# Patient Record
Sex: Female | Born: 1990 | Race: White | Hispanic: No | Marital: Single | Smoking: Never smoker
Health system: Southern US, Community
[De-identification: ages and names within clinical notes are randomized; demographics above are authoritative.]

## PROBLEM LIST (undated history)

## (undated) DIAGNOSIS — I10 Essential (primary) hypertension: Secondary | ICD-10-CM

---
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
## 2011-06-15 ENCOUNTER — Emergency Department: Payer: Self-pay | Admitting: Emergency Medicine

## 2011-06-19 ENCOUNTER — Emergency Department: Payer: Self-pay | Admitting: Emergency Medicine

## 2011-11-04 ENCOUNTER — Emergency Department: Payer: Self-pay | Admitting: Emergency Medicine

## 2013-06-05 ENCOUNTER — Emergency Department: Payer: Self-pay | Admitting: Emergency Medicine

## 2013-06-08 LAB — BETA STREP CULTURE(ARMC)

## 2014-02-13 ENCOUNTER — Emergency Department: Payer: Self-pay | Admitting: Emergency Medicine

## 2014-04-04 ENCOUNTER — Emergency Department: Payer: Self-pay | Admitting: Student

## 2014-04-04 LAB — CBC
HCT: 38.5 % (ref 35.0–47.0)
HGB: 12.2 g/dL (ref 12.0–16.0)
MCH: 25.4 pg — AB (ref 26.0–34.0)
MCHC: 31.6 g/dL — ABNORMAL LOW (ref 32.0–36.0)
MCV: 81 fL (ref 80–100)
PLATELETS: 326 10*3/uL (ref 150–440)
RBC: 4.78 10*6/uL (ref 3.80–5.20)
RDW: 15 % — ABNORMAL HIGH (ref 11.5–14.5)
WBC: 14.4 10*3/uL — ABNORMAL HIGH (ref 3.6–11.0)

## 2014-04-04 LAB — MONONUCLEOSIS SCREEN: MONO TEST: NEGATIVE

## 2014-04-06 LAB — BETA STREP CULTURE(ARMC)

## 2014-08-10 ENCOUNTER — Emergency Department: Admit: 2014-08-10 | Disposition: A | Discharge status: Home / Self Care | Payer: Self-pay | Admitting: Student

## 2015-07-19 ENCOUNTER — Encounter: Payer: Self-pay | Admitting: Emergency Medicine

## 2015-07-19 ENCOUNTER — Emergency Department
Admission: EM | Admit: 2015-07-19 | Discharge: 2015-07-19 | Disposition: A | Discharge status: Home / Self Care | Payer: Self-pay | Attending: Student | Admitting: Student

## 2015-07-19 DIAGNOSIS — J020 Streptococcal pharyngitis: Secondary | ICD-10-CM | POA: Insufficient documentation

## 2015-07-19 DIAGNOSIS — I10 Essential (primary) hypertension: Secondary | ICD-10-CM | POA: Insufficient documentation

## 2015-07-19 HISTORY — DX: Essential (primary) hypertension: I10

## 2015-07-19 LAB — POCT RAPID STREP A: STREPTOCOCCUS, GROUP A SCREEN (DIRECT): POSITIVE — AB

## 2015-07-19 MED ORDER — LIDOCAINE VISCOUS 2 % MT SOLN: 5.0000 mL | Freq: Four times a day (QID) | OROMUCOSAL | Status: DC | PRN

## 2015-07-19 MED ORDER — PSEUDOEPH-BROMPHEN-DM 30-2-10 MG/5ML PO SYRP: 5.0000 mL | ORAL_SOLUTION | Freq: Four times a day (QID) | ORAL | Status: DC | PRN

## 2015-07-19 MED ORDER — AMOXICILLIN 500 MG PO CAPS: 500.0000 mg | ORAL_CAPSULE | Freq: Three times a day (TID) | ORAL | Status: DC

## 2015-07-19 NOTE — ED Provider Notes (Signed)
[facility] Emergency Department Provider Note ____________________________________________  Time seen: Approximately 8:21 AM  I have reviewed the triage vital signs and the nursing notes.   HISTORY  Chief Complaint Sore Throat    HPI Vanessa Cowan is a 25 y.o. female patient complaining of sore throat for 3 days. Patient denies any other URI signs or symptoms. Patient stated no fever associated with this complaint.Patient state able to tolerate fluids but food causes discomfort. No palliative measures for this complaint. Patient rates her pain discomfort as a 6/10. Patient described the pain as sharp.   Past Medical History  Diagnosis Date  . Hypertension     There are no active problems to display for this patient.   History reviewed. No pertinent past surgical history.  Current Outpatient Rx  Name  Route  Sig  Dispense  Refill  . amoxicillin (AMOXIL) 500 MG capsule   Oral   Take 1 capsule (500 mg total) by mouth 3 (three) times daily.   30 capsule   0   . brompheniramine-pseudoephedrine-DM 30-2-10 MG/5ML syrup   Oral   Take 5 mLs by mouth 4 (four) times daily as needed. Mixed with 5 mL of viscous lidocaine swish and swallow.   120 mL   0   . lidocaine (XYLOCAINE) 2 % solution   Mouth/Throat   Use as directed 5 mLs in the mouth or throat every 6 (six) hours as needed for mouth pain. Mixed with 5 mL of Bromfed-DM for swish and swallow   100 mL   0     Allergies Review of patient's allergies indicates no known allergies.  No family history on file.  Social History Social History  Substance Use Topics  . Smoking status: Never Smoker   . Smokeless tobacco: None  . Alcohol Use: None    Review of Systems Constitutional: No fever/chills Eyes: No visual changes. ENT: No sore throat. Cardiovascular: Denies chest pain. Respiratory: Denies shortness of breath. Gastrointestinal: No abdominal pain.  No nausea, no vomiting.  No  diarrhea.  No constipation. Genitourinary: Negative for dysuria. Musculoskeletal: Negative for back pain. Skin: Negative for rash. Neurological: Negative for headaches, focal weakness or numbness. Endocrine:Hypertension  ____________________________________________   PHYSICAL EXAM:  VITAL SIGNS: ED Triage Vitals  Enc Vitals Group     BP 07/19/15 0808 160/88 mmHg     Pulse Rate 07/19/15 0808 84     Resp 07/19/15 0808 18     Temp 07/19/15 0808 98.6 F (37 C)     Temp Source 07/19/15 0808 Oral     SpO2 07/19/15 0808 97 %     Weight 07/19/15 0808 320 lb (145.151 kg)     Height 07/19/15 0808 5\' 7"  (1.702 m)     Head Cir --      Peak Flow --      Pain Score 07/19/15 0805 6     Pain Loc --      Pain Edu? --      Excl. in [place]? --     Constitutional: Alert and oriented. Well appearing and in no acute distress. Eyes: Conjunctivae are normal. PERRL. EOMI. Head: Atraumatic. Nose: No congestion/rhinnorhea. Mouth/Throat: Mucous membranes are moist.  Oropharynx erythematous. Edematous and exudative tonsils Neck: No stridor.  No cervical spine tenderness to palpation. Hematological/Lymphatic/Immunilogical: Bilateral cervical lymphadenopathy. Cardiovascular: Normal rate, regular rhythm. Grossly normal heart sounds.  Good peripheral circulation. Elevated blood pressure Respiratory: Normal respiratory effort.  No retractions. Lungs CTAB. Gastrointestinal: Soft and nontender.  No distention. No abdominal bruits. No CVA tenderness. Musculoskeletal: No lower extremity tenderness nor edema.  No joint effusions. Neurologic:  Normal speech and language. No gross focal neurologic deficits are appreciated. No gait instability. Skin:  Skin is warm, dry and intact. No rash noted. Psychiatric: Mood and affect are normal. Speech and behavior are normal.  ____________________________________________   LABS (all labs ordered are listed, but only abnormal results are displayed)  Labs Reviewed  POCT  RAPID STREP A - Abnormal; Notable for the following:    Streptococcus, Group A Screen (Direct) POSITIVE (*)    All other components within normal limits   ____________________________________________  EKG   ____________________________________________  RADIOLOGY   ____________________________________________   PROCEDURES  Procedure(s) performed: None  Critical Care performed: No  ____________________________________________   INITIAL IMPRESSION / ASSESSMENT AND PLAN / ED COURSE  Pertinent labs & imaging results that were available during my care of the patient were reviewed by me and considered in my medical decision making (see chart for details).  Strep pharyngitis. Patient given discharge care instructions. Patient given prescription for amoxicillin, Bromfed-DM, and viscous lidocaine. Advised to follow-up with "clinic if condition persists. ____________________________________________   FINAL CLINICAL IMPRESSION(S) / ED DIAGNOSES  Final diagnoses:  Strep pharyngitis      Sable Feil, PA-C 07/19/15 [id number]  Joanne Gavel, MD 07/19/15 1622

## 2015-07-19 NOTE — ED Notes (Signed)
Sore throat x 3 days.  No resp distress.

## 2015-07-19 NOTE — Discharge Instructions (Signed)

## 2015-07-25 ENCOUNTER — Other Ambulatory Visit: Payer: Self-pay | Admitting: Obstetrics and Gynecology

## 2015-07-25 DIAGNOSIS — N6452 Nipple discharge: Secondary | ICD-10-CM

## 2015-08-01 ENCOUNTER — Ambulatory Visit
Admission: RE | Admit: 2015-08-01 | Discharge: 2015-08-01 | Disposition: A | Discharge status: Home / Self Care | Payer: 59 | Source: Ambulatory Visit | Attending: Obstetrics and Gynecology | Admitting: Obstetrics and Gynecology

## 2015-08-01 DIAGNOSIS — N6452 Nipple discharge: Secondary | ICD-10-CM | POA: Diagnosis not present

## 2015-08-06 ENCOUNTER — Other Ambulatory Visit: Payer: Self-pay | Admitting: Obstetrics and Gynecology

## 2015-08-06 DIAGNOSIS — N6042 Mammary duct ectasia of left breast: Secondary | ICD-10-CM

## 2015-08-06 DIAGNOSIS — N6459 Other signs and symptoms in breast: Secondary | ICD-10-CM

## 2015-08-27 ENCOUNTER — Encounter: Payer: Self-pay | Admitting: Dietician

## 2015-08-27 ENCOUNTER — Encounter: Payer: 59 | Attending: Obstetrics and Gynecology | Admitting: Dietician

## 2015-08-27 VITALS — Ht 67.0 in | Wt 338.8 lb

## 2015-08-27 DIAGNOSIS — E669 Obesity, unspecified: Secondary | ICD-10-CM | POA: Insufficient documentation

## 2015-08-27 NOTE — Addendum Note (Signed)
Addended by: Gloriajean Dell on: 08/27/2015 11:58 AM   Modules accepted: Medications

## 2015-08-27 NOTE — Progress Notes (Signed)
Medical Nutrition Therapy: Visit start time: 8:50am  end time:9:50 Assessment:  Diagnosis: obesity Past medical history: hypertension Psychosocial issues/ stress concerns: Patient rates her stress as moderate and indicates she copes by eating. Preferred learning method:  . Auditory . Hands-on Current weight: 338.8 lbs  Height: 67 in  Medications, supplements: see list  Progress and evaluation:  Patient in for initial medical nutrition therapy assessment. Her main focus is desire to lose weight. She gives a goal weight of 200 lbs which she weighed in high school. She attributes excessive calories to frequent eating out or take outs from fast foods, late night snacking and frequent intake of soda and sweet tea.She likes a variety of fruits and vegetables and likes milk and yogurt but diet is low in these food groups. In the past week, she has made positive diet changes and reports she is eating less, has decreased soda intake and has limited late night snacking. She prefers salty, crunchy snacks to sweets.   Physical activity: none. 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  Dietary Intake:  Usual eating pattern includes 2 meals and 1-2snacks per day. Dining out frequency: 3 meals per week.  Breakfast: usually skips breakfast. She takes her niece to school and then comes home and sleeps from 8-10:30am.  Lunch: 11:30am- pizza or grilled chicken salad or chicken sandwich, salad, sweet tea Snack: chips Supper: 5:30-6:00pm-baked chicken, rice, pinto or black beans, water or sweet tea or steak with same sides  Snack: has not been eating after dinner snack for the past week. Previously she would eat a hamburger for example. Beverages: water, sweet tea   Nutrition Care Education: Basic nutrition: Instructed on food group servings needed to meet basic nutrient needs Weight control: Instructed on a meal pattern of 3 meals per day including breakfast and  used food guide plate and food models to show how to better balance meals especially by adding fruits and vegetables. Encouraged to focus on adding foods needed to meet nutrient needs vs. focus on restriction. Commended on positive changes she has made thus far. Discussed how preparing more meals at home and eating less fast food will help in decreasing her sodium intake as well discussing relation of sodium to hypertension. Gave and reviewed menus. Encouraged to include some structured exercise especially since she has a gym membership even if she starts with one day per week. Nutritional Diagnosis:  [place]-3.3 Overweight/obesity As related to history of frequent high fat fast food meals, sweetened beverages, late night eating and lack of physical activity.  As evidenced by diet history and weight gain of over 100 lbs in 7 years..  Intervention:  Include breakfast daily. Balance meals with protein, 2-4 servings of carbohydrate and non-starchy vegetables. Estimate carbohydrate servings at meals and snacks. Spread 14 servings of carbohydrate over 3 meals and 2-3 snacks. Measure some portions of carbohydrate foods, especially starches. Read labels for carbohydrate grams remembering that 15 gms of carbohydrate equals one serving. Be mindful of meals and snacks. Avoid eating in front of TV or while using other phone. Eat slowly. Allow at least 20 minutes before taking out "seconds".   Education Materials given:  . Plate Planner . Food lists/ Planning A Balanced Meal . Sample meal pattern/ menus . Goals/ instructions  Learner/ who was taught:  . Patient  Level of understanding: . Partial understanding; needs review/ practice Learning barriers: . None  Willingness to learn/ readiness for  change: . Eager, change in progress  Monitoring and Evaluation:  No follow-up scheduled. Encouraged patient to call if she decides she wants to schedule another appointment or if has questions regarding her  diet/nutrition.

## 2015-08-27 NOTE — Patient Instructions (Signed)
Include breakfast daily. Balance meals with protein, 2-4 servings of carbohydrate and non-starchy vegetables. Estimate carbohydrate servings at meals and snacks. Spread 14 servings of carbohydrate over 3 meals and 2-3 snacks. Measure some portions of carbohydrate foods, especially starches. Read labels for carbohydrate grams remembering that 15 gms of carbohydrate equals one serving. Be mindful of meals and snacks. Avoid eating in front of TV or while using other phone. Eat slowly. Allow at least 20 minutes before taking out "seconds".

## 2015-09-04 ENCOUNTER — Ambulatory Visit
Admission: RE | Admit: 2015-09-04 | Discharge: 2015-09-04 | Disposition: A | Discharge status: Home / Self Care | Payer: 59 | Source: Ambulatory Visit | Attending: Obstetrics and Gynecology | Admitting: Obstetrics and Gynecology

## 2015-09-04 ENCOUNTER — Other Ambulatory Visit: Payer: Self-pay | Admitting: Obstetrics and Gynecology

## 2015-09-04 DIAGNOSIS — N6042 Mammary duct ectasia of left breast: Secondary | ICD-10-CM

## 2015-09-04 DIAGNOSIS — N6459 Other signs and symptoms in breast: Secondary | ICD-10-CM

## 2015-09-05 LAB — SURGICAL PATHOLOGY

## 2015-09-06 HISTORY — PX: BREAST BIOPSY: SHX20

## 2015-09-18 ENCOUNTER — Other Ambulatory Visit: Payer: Self-pay | Admitting: Obstetrics and Gynecology

## 2015-09-18 DIAGNOSIS — D242 Benign neoplasm of left breast: Secondary | ICD-10-CM

## 2015-09-30 ENCOUNTER — Ambulatory Visit
Admission: RE | Admit: 2015-09-30 | Discharge: 2015-09-30 | Disposition: A | Discharge status: Home / Self Care | Payer: 59 | Source: Ambulatory Visit | Attending: Obstetrics and Gynecology | Admitting: Obstetrics and Gynecology

## 2015-09-30 DIAGNOSIS — D242 Benign neoplasm of left breast: Secondary | ICD-10-CM | POA: Diagnosis present

## 2017-05-15 ENCOUNTER — Encounter: Payer: Self-pay | Admitting: Emergency Medicine

## 2017-05-15 ENCOUNTER — Emergency Department
Admission: EM | Admit: 2017-05-15 | Discharge: 2017-05-15 | Disposition: A | Discharge status: Home / Self Care | Payer: 59 | Attending: Student in an Organized Health Care Education/Training Program | Admitting: Student in an Organized Health Care Education/Training Program

## 2017-05-15 ENCOUNTER — Other Ambulatory Visit: Payer: Self-pay

## 2017-05-15 ENCOUNTER — Emergency Department: Payer: 59

## 2017-05-15 DIAGNOSIS — J189 Pneumonia, unspecified organism: Secondary | ICD-10-CM

## 2017-05-15 DIAGNOSIS — I10 Essential (primary) hypertension: Secondary | ICD-10-CM | POA: Diagnosis not present

## 2017-05-15 DIAGNOSIS — J181 Lobar pneumonia, unspecified organism: Secondary | ICD-10-CM | POA: Diagnosis not present

## 2017-05-15 DIAGNOSIS — M7918 Myalgia, other site: Secondary | ICD-10-CM | POA: Insufficient documentation

## 2017-05-15 DIAGNOSIS — R05 Cough: Secondary | ICD-10-CM | POA: Diagnosis present

## 2017-05-15 LAB — INFLUENZA PANEL BY PCR (TYPE A & B)
INFLBPCR: NEGATIVE
Influenza A By PCR: NEGATIVE

## 2017-05-15 MED ORDER — IPRATROPIUM-ALBUTEROL 0.5-2.5 (3) MG/3ML IN SOLN: 3.0000 mL | Freq: Once | RESPIRATORY_TRACT | Status: DC

## 2017-05-15 MED ORDER — LABETALOL HCL 200 MG PO TABS: 200.0000 mg | ORAL_TABLET | Freq: Two times a day (BID) | ORAL | 3 refills | Status: AC

## 2017-05-15 MED ORDER — LEVOFLOXACIN 500 MG PO TABS: 500.0000 mg | ORAL_TABLET | Freq: Every day | ORAL | 0 refills | Status: DC

## 2017-05-15 MED ORDER — HYDROCODONE-HOMATROPINE 5-1.5 MG/5ML PO SYRP: 5.0000 mL | ORAL_SOLUTION | Freq: Four times a day (QID) | ORAL | 0 refills | Status: DC | PRN

## 2017-05-15 MED ORDER — IPRATROPIUM-ALBUTEROL 0.5-2.5 (3) MG/3ML IN SOLN
3.0000 mL | Freq: Once | RESPIRATORY_TRACT | Status: AC
  Administered 2017-05-15: 3 mL via RESPIRATORY_TRACT
  Filled 2017-05-15: qty 3

## 2017-05-15 NOTE — ED Triage Notes (Addendum)
Pt c/o productive cough and body aches for 3-4 days, reports subjective fevers.  OTC meds not helping. Mask applied in triage.  Pt also states she has been out of her BP meds for the past week.

## 2017-05-15 NOTE — ED Provider Notes (Signed)
[facility] Emergency Department Provider Note  ____________________________________________   First MD Initiated Contact with Patient 05/15/17 1006     (approximate)  I have reviewed the triage vital signs and the nursing notes.   HISTORY  Chief Complaint Generalized Body Aches and Cough    HPI Vanessa Cowan is a 27 y.o. female who presents to the ER complaining of a productive cough and body aches for 3-4 days.  She states she has had fever while at home.  She states her chest does hurt and she feels short of breath at times.  She denies any vomiting or diarrhea.  She states she is also out of her blood pressure medication  Past Medical History:  Diagnosis Date  . Hypertension     There are no active problems to display for this patient.   Past Surgical History:  Procedure Laterality Date  . BREAST BIOPSY Left 5.19.17    Prior to Admission medications   Medication Sig Start Date End Date Taking? Authorizing Provider  HYDROcodone-homatropine (HYCODAN) 5-1.5 MG/5ML syrup Take 5 mLs by mouth every 6 (six) hours as needed for cough. 05/15/17   Farron Lafond, Linden Dolin, PA-C  IRON, FERROUS SULFATE, PO Take by mouth.    [provider]  labetalol (NORMODYNE) 200 MG tablet Take 1 tablet (200 mg total) by mouth 2 (two) times daily. 05/15/17   Teighlor Korson, Linden Dolin, PA-C  levofloxacin (LEVAQUIN) 500 MG tablet Take 1 tablet (500 mg total) by mouth daily. 05/15/17   Versie Starks, PA-C    Allergies Patient has no known allergies.  Family History  Problem Relation Age of Onset  . Breast cancer Neg Hx     Social History Social History   Tobacco Use  . Smoking status: Never Smoker  . Smokeless tobacco: Never Used  Substance Use Topics  . Alcohol use: Not on file  . Drug use: Not on file    Review of Systems  Constitutional: Positive fever/chills Eyes: No visual changes. ENT: Positive sore throat. Respiratory: Positive cough, wheezing and  some shortness of breath Genitourinary: Negative for dysuria. Musculoskeletal: Negative for back pain. Skin: Negative for rash.    ____________________________________________   PHYSICAL EXAM:  VITAL SIGNS: ED Triage Vitals  Enc Vitals Group     BP 05/15/17 0911 (!) 190/122     Pulse Rate 05/15/17 0911 (!) 127     Resp 05/15/17 0911 20     Temp 05/15/17 0911 99.7 F (37.6 C)     Temp Source 05/15/17 0911 Oral     SpO2 05/15/17 0911 95 %     Weight 05/15/17 0913 290 lb (131.5 kg)     Height 05/15/17 0913 5\' 7"  (1.702 m)     Head Circumference --      Peak Flow --      Pain Score 05/15/17 0912 7     Pain Loc --      Pain Edu? --      Excl. in [place]? --     Constitutional: Alert and oriented.  Patient appears to not feel well but she does not appear toxic  eyes: Conjunctivae are normal.  Head: Atraumatic. Ears: TMs are dull bilaterally Nose: No congestion/rhinnorhea. Mouth/Throat: Mucous membranes are moist.  Throat is red, mouth is dry tongue is coated with white Cardiovascular: Normal rate, regular rhythm.  Heart sounds are normal Respiratory: Normal respiratory effort.  No retractions, lungs with decreased breath sounds in the left mid to upper lobe  GU: deferred Musculoskeletal: FROM all extremities, warm and well perfused Neurologic:  Normal speech and language.  Skin:  Skin is warm, dry and intact. No rash noted. Psychiatric: Mood and affect are normal. Speech and behavior are normal.  ____________________________________________   LABS (all labs ordered are listed, but only abnormal results are displayed)  Labs Reviewed  INFLUENZA PANEL BY PCR (TYPE A & B)   ____________________________________________   ____________________________________________  RADIOLOGY  Chest x-ray is positive for left upper lobe pneumonia  ____________________________________________   PROCEDURES  Procedure(s) performed:  DuoNeb  Procedures    ____________________________________________   INITIAL IMPRESSION / ASSESSMENT AND PLAN / ED COURSE  Pertinent labs & imaging results that were available during my care of the patient were reviewed by me and considered in my medical decision making (see chart for details).  Patient is a 27 year old female presents emergency department complaining of fever and cough.  On physical exam she appears to not feel well, her blood pressure is elevated, her temp is not elevated but she feels warm to touch.  Pulse ox is at 95%.  Lungs were clear except for she had decreased breath sounds in the left mid to upper lobe  Flu test is negative, chest x-ray was positive for left upper lobe pneumonia  X-ray results were discussed with the patient.  After the DuoNeb the patient's lungs are more clear to auscultation.  The patient states she feels better.  She was given a prescription for Levaquin for 500 mg daily for 10 days, Hycodan cough syrup, and a refill prescription on her labetalol 200 mg daily.  She is to follow-up with her regular doctor in 3 days.  She is to call on Monday morning for an appointment and let them know she was seen in the emergency room and diagnosed with pneumonia.  If she is worsening she is to return to the emergency department.  She is to take all medications as prescribed.  Patient states she understands will comply with our recommendations.  She was discharged in stable condition     As part of my medical decision making, I reviewed the following data within the [facility] notes reviewed and incorporated, Labs reviewed , Old chart reviewed, Radiograph reviewed , Notes from prior ED visits and her primary care visits to determine her blood pressure medication ____________________________________________   FINAL CLINICAL IMPRESSION(S) / ED DIAGNOSES  Final diagnoses:  Community acquired pneumonia of left upper lobe of lung ([place])       NEW MEDICATIONS STARTED DURING THIS VISIT:  Discharge Medication List as of 05/15/2017 11:16 AM    START taking these medications   Details  HYDROcodone-homatropine (HYCODAN) 5-1.5 MG/5ML syrup Take 5 mLs by mouth every 6 (six) hours as needed for cough., Starting Sat 05/15/2017, Print    levofloxacin (LEVAQUIN) 500 MG tablet Take 1 tablet (500 mg total) by mouth daily., Starting Sat 05/15/2017, Print         Note:  This document was prepared using Dragon voice recognition software and may include unintentional dictation errors.    Versie Starks, PA-C 05/15/17 1805    Merlyn Lot, MD 05/16/17 [phone]

## 2017-05-15 NOTE — Discharge Instructions (Signed)
Follow-up with your regular doctor in 3-5 days for recheck.  Return to the emergency department if you are worsening.  Use medication as prescribed.  Be sure to drink plenty of fluids.  Can still take over-the-counter cold medications as needed.  The Hycodan cough syrup has a narcotic in it.  Please be careful as this can be addictive.  Be aware sedation with that same medication.  He has been given a prescription for your blood pressure medicine.  Please follow-up with your regular doctor for additional refills.

## 2017-06-02 IMAGING — US US BREAST BX W LOC DEV 1ST LESION IMG BX SPEC US GUIDE*L*
1 series · 9 of 9 positions shown · non-contrast
Comparison: Previous exam(s).

CLINICAL DATA: 24-year-old female with spontaneous left nipple
discharge. Dilated duct with vascularity was visualized during a
diagnostic study performed on 08/01/2015. Ultrasound-guided core
biopsy recommended.

EXAM:
ULTRASOUND GUIDED LEFT BREAST CORE NEEDLE BIOPSY

[Series 1: us breast bx w loc dev 1st lesion img bx spec us g · 0.06mm/px · 9 of 9 slices shown]
[im 1/9]
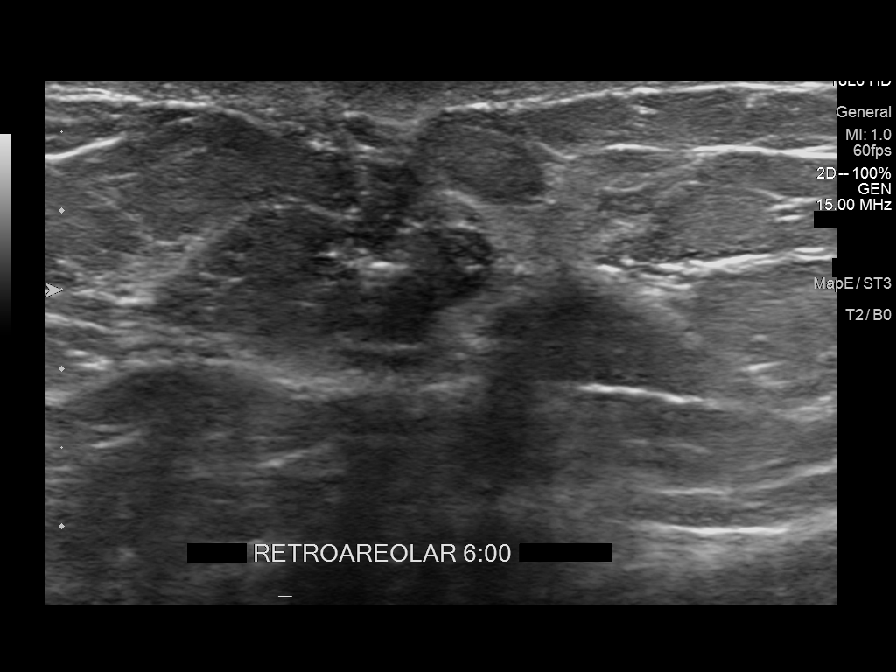
[im 2/9]
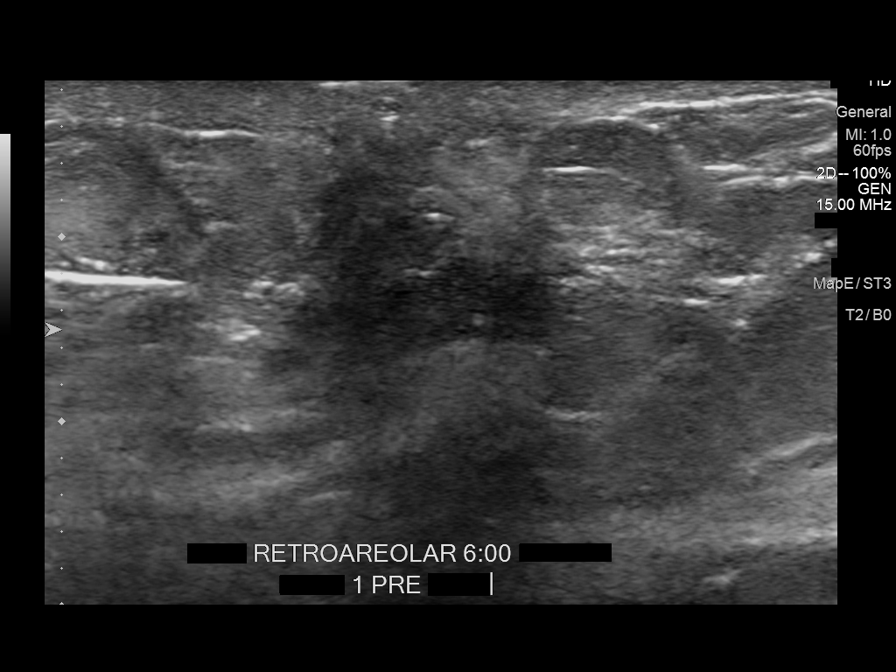
[im 3/9]
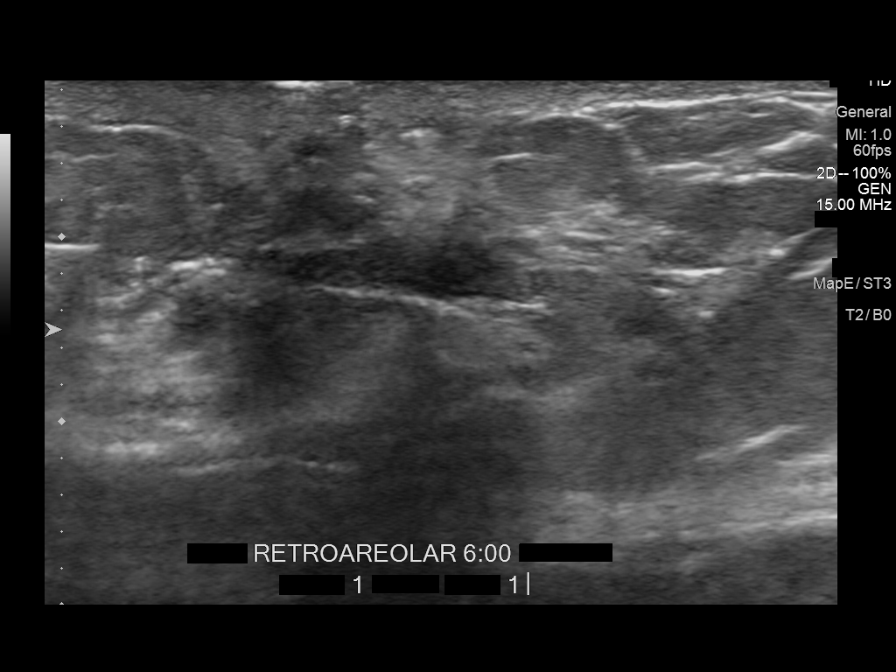
[im 4/9]
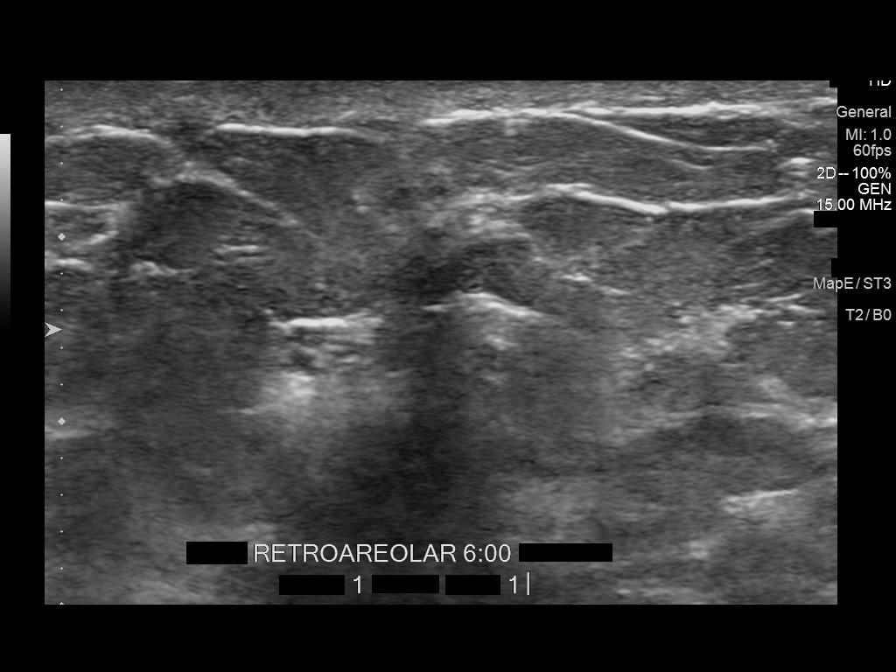
[im 5/9]
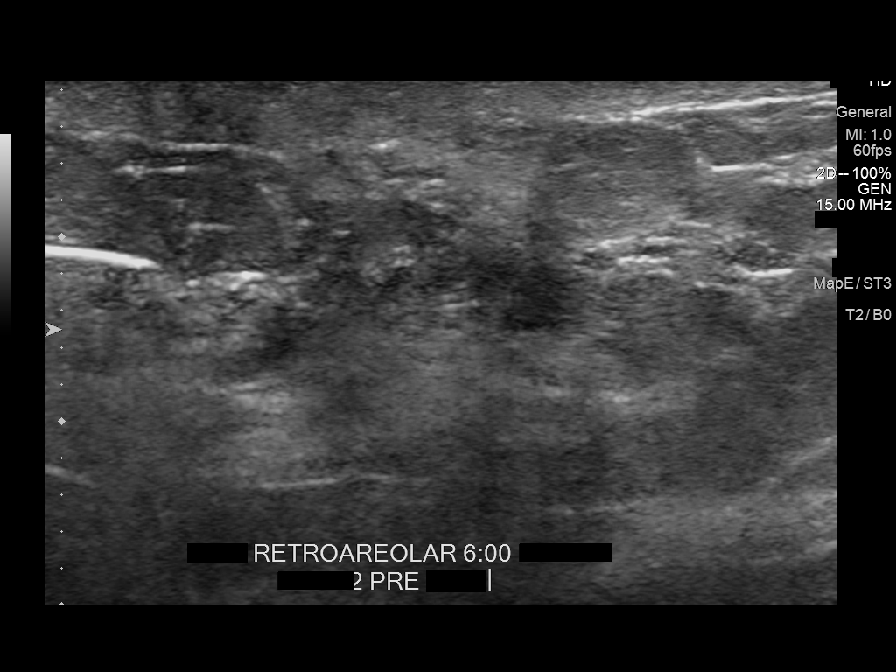
[im 6/9]
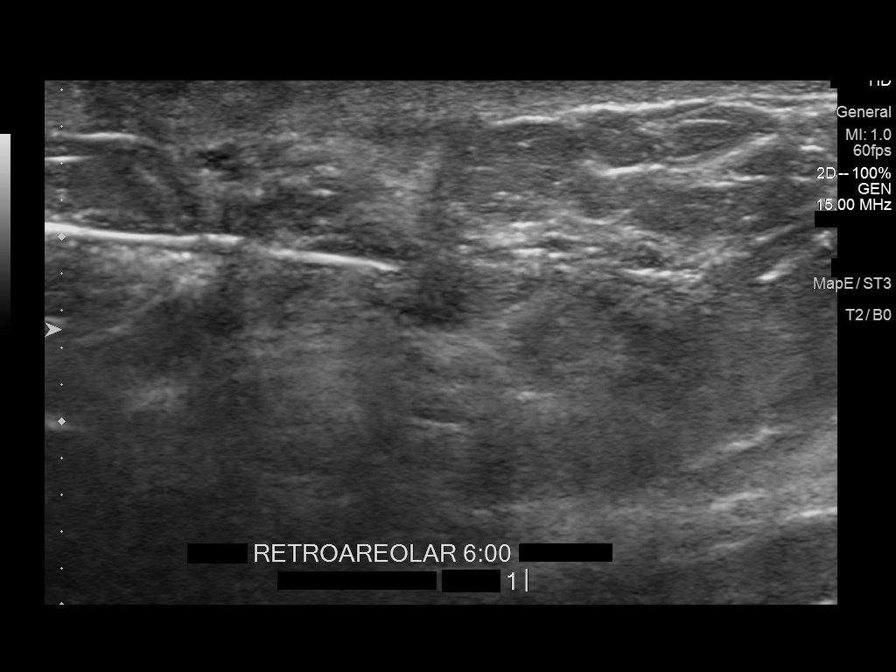
[im 7/9]
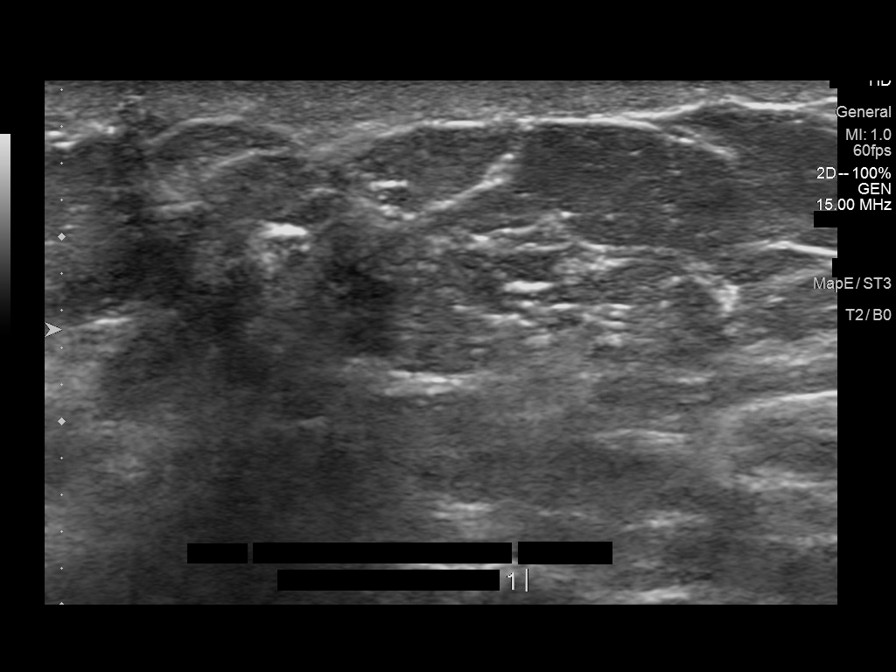
[im 8/9]
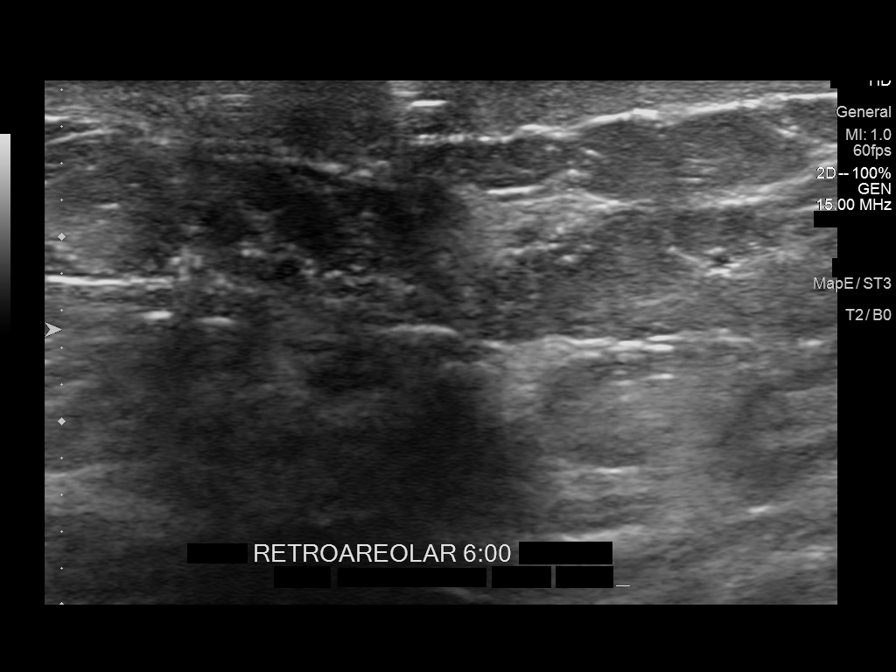
[im 9/9]
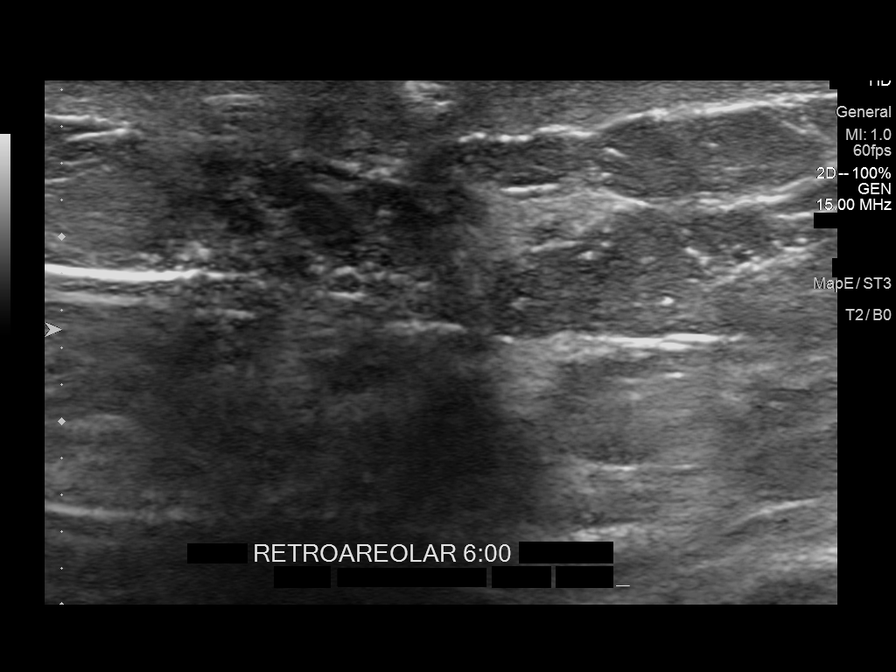

[9 of 9 positions shown; findings below may reference images not displayed]



Using sterile technique and 1% Lidocaine as local anesthetic, under
direct ultrasound visualization, a 14 gauge Elert device was
used to perform biopsy of a duct in the 6 o'clock subareolar region
of the left breast using a lateral to medial approach. At the
conclusion of the procedure a coil shaped tissue marker clip was
deployed into the biopsy cavity. Follow up 2 view mammogram was
performed and dictated separately.
IMPRESSION: Ultrasound guided biopsy of the left breast. No apparent
complications.

## 2017-12-02 IMAGING — US US BREAST*L* LIMITED INC AXILLA
2 series · 13 of 17 positions shown · non-contrast
Comparison: Previous exam(s).

CLINICAL DATA: Known retroareolar left breast papilloma awaiting
surgical excision; post-biopsy mammogram showed possible mass
posterior 12 o'clock left breast, for further evaluation today

EXAM:
2D DIGITAL DIAGNOSTIC LEFT MAMMOGRAM WITH CAD AND ADJUNCT TOMO
ULTRASOUND LEFT BREAST

[Series 1: us breast*left* limited inc axilla · 0.05mm/px · 12 of 16 slices shown (1 of 2)]
[im 1/16]
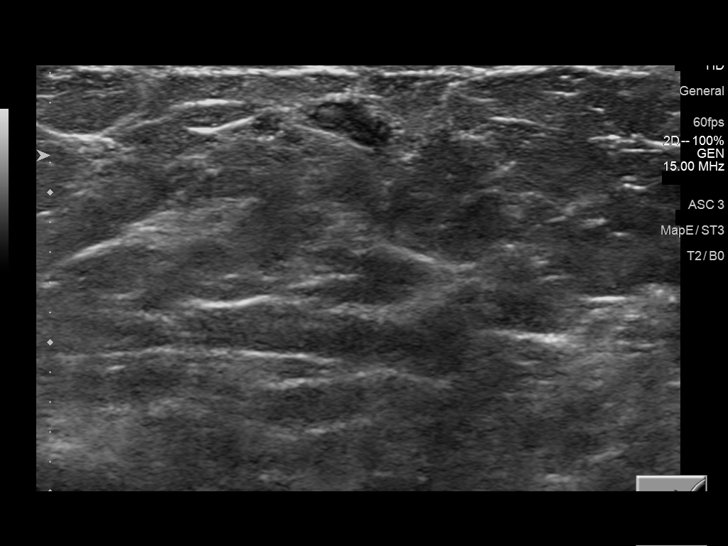
[im 2/16]
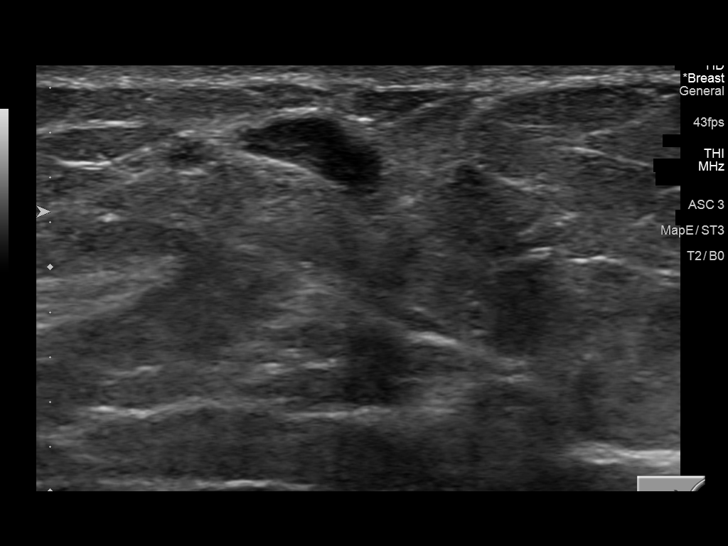
[im 4/16]
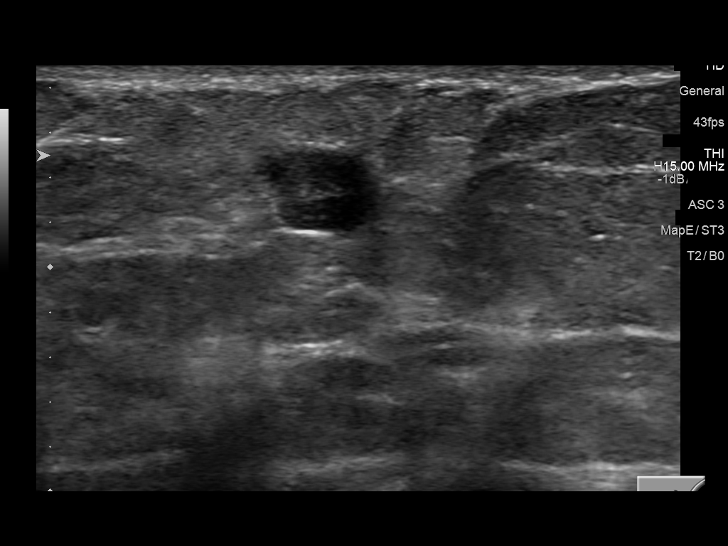
[im 5/16]
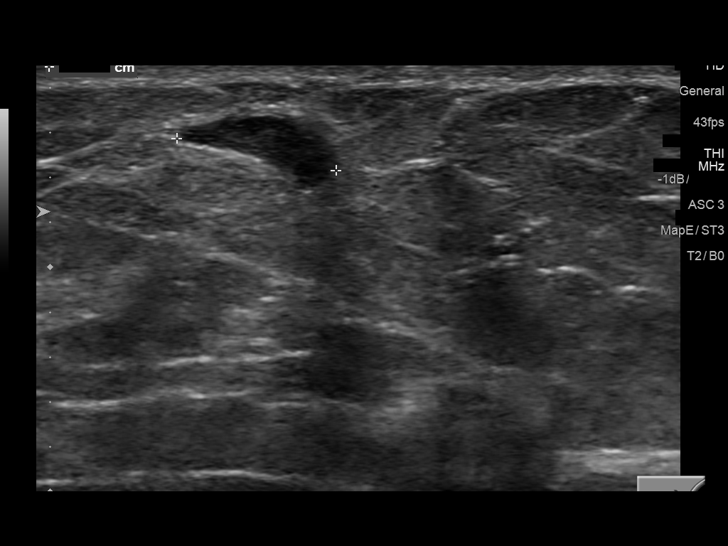
[im 6/16]
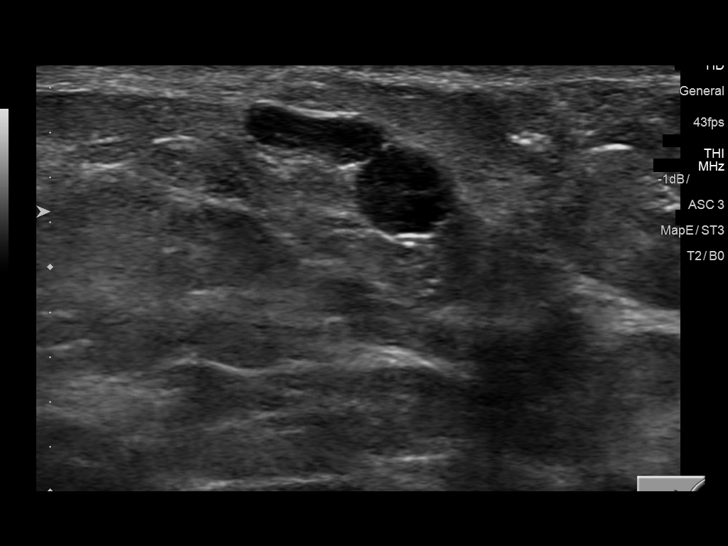
[im 8/16]
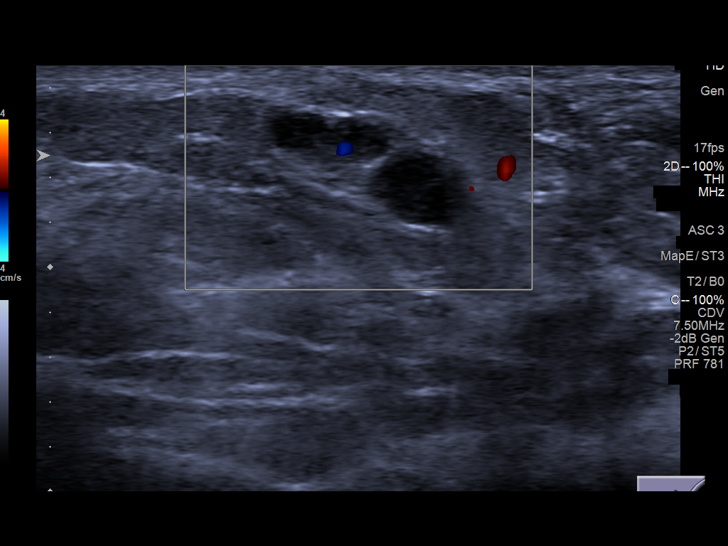
[im 9/16]
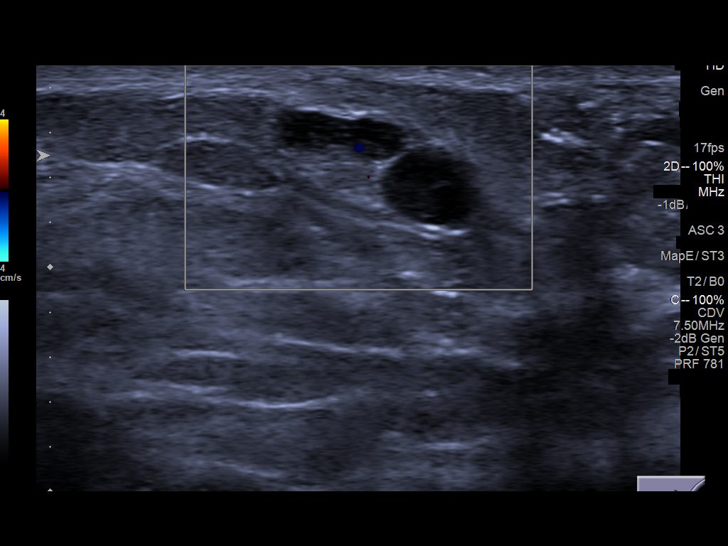
[im 10/16]
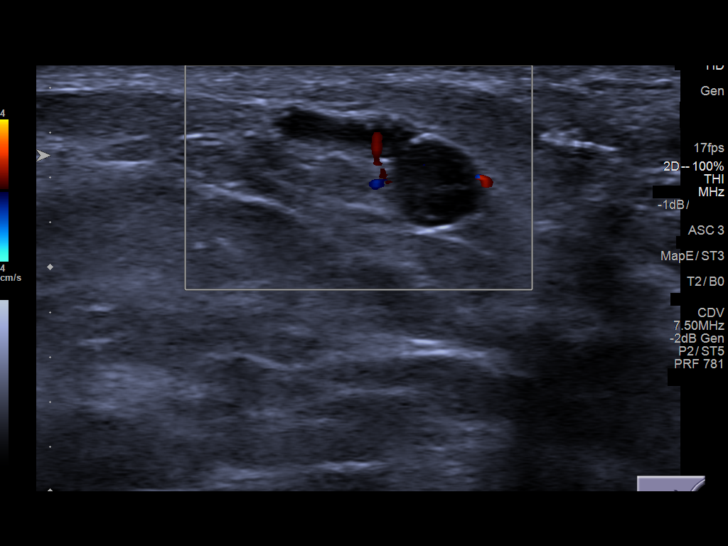
[im 12/16]
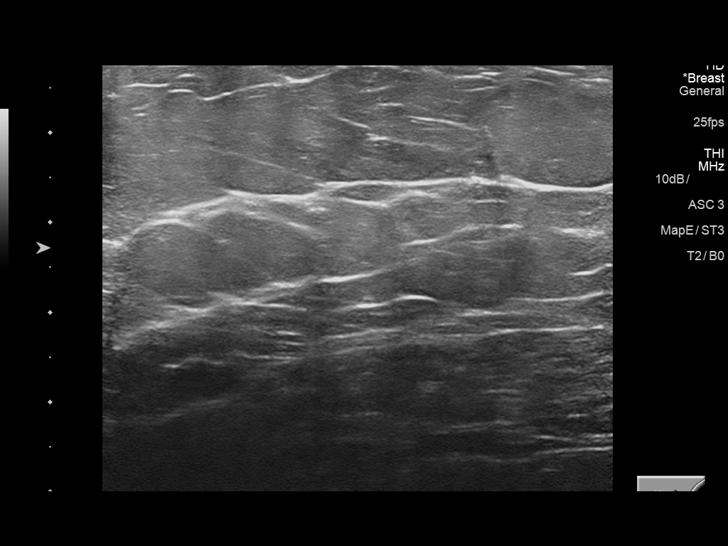
[im 13/16]
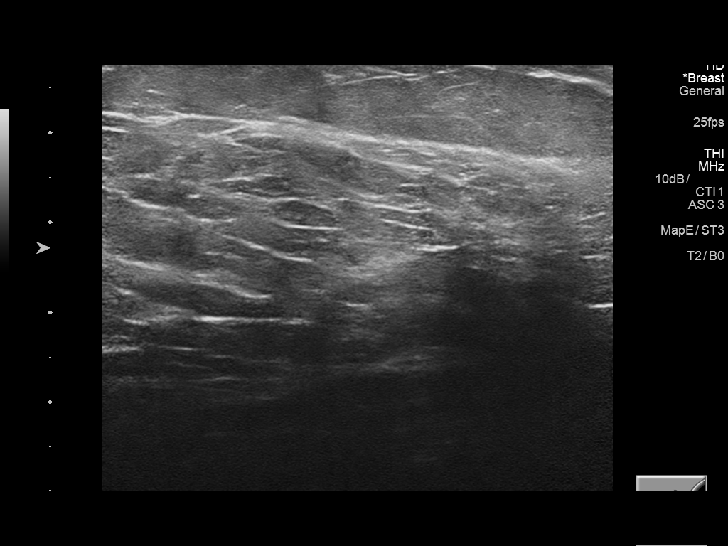
[im 14/16]
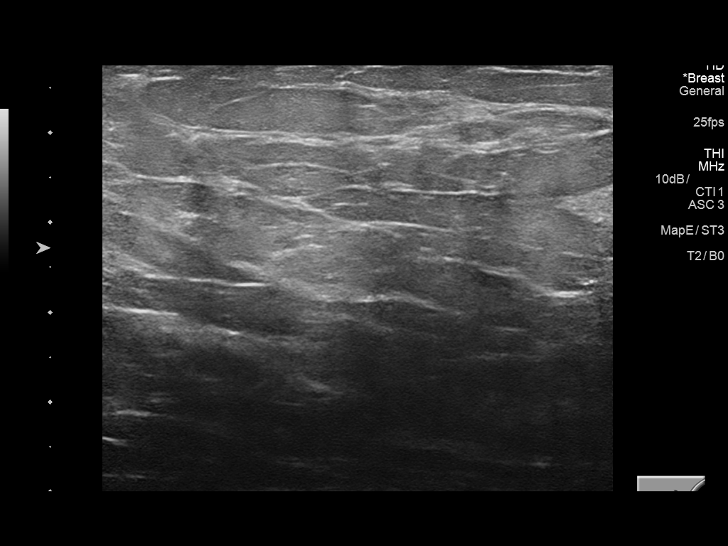
[im 16/16]
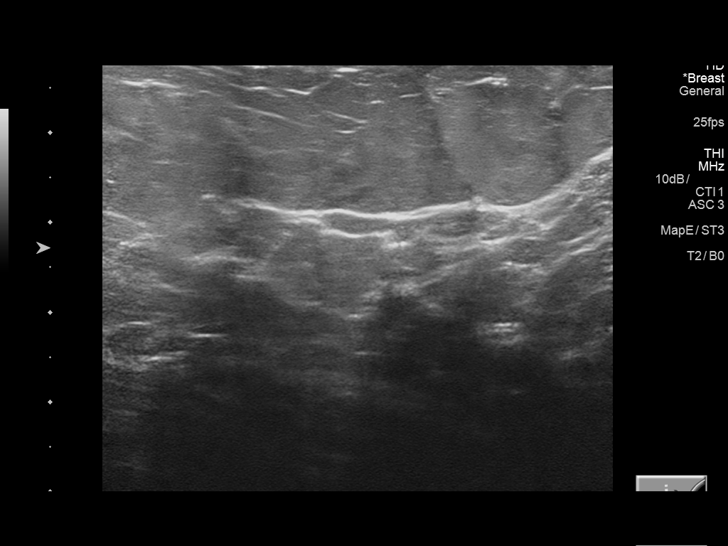

[Series 2: us breast*left* limited inc axilla · 0.07mm/px · 1 of 1 slices shown (2 of 2)]
[im 1/1]
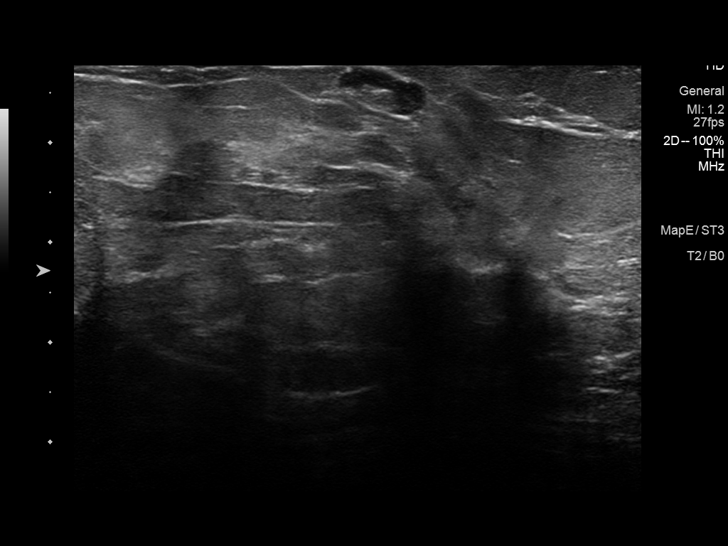

[13 of 17 positions shown; findings below may reference images not displayed]

ACR Breast Density Category b: There are scattered areas of
fibroglandular density.
FINDINGS: Marker clip in retroareolar left breast indicating site of recent
biopsy.

Full and spot compression images with tomosynthesis of the left
breast show subtle asymmetry in the posterior 12 o'clock position,
but no definite mass and no distortion. Additionally, there is a
circumscribed 1 cm mass in the lateral left breast that appears most
consistent with a lymph node.

Mammographic images were processed with CAD.

On physical exam, there are no palpable findings.

Targeted ultrasound is performed, showing no abnormalities in the
vicinity of the 12 o'clock position of the left breast. In the [DATE]
position, 4 cm from the nipple, there is a an oval circumscribed
hypoechoic mass with parallel orientation and equal sound
transmission, showing central echogenic region, and measuring 7 x 10
x 5 mm.
IMPRESSION: Probably benign posterior 12 o'clock sub-cm focus of asymmetric
breast tissue. Probable lymph node lateral left breast.

RECOMMENDATION:
1. Proceed with surgical consultation as scheduled for excision of
papilloma

2. Six month diagnostic left mammogram and ultrasound to reevaluate
the probably benign asymmetry and probable intramammary lymph node.

I have discussed the findings and recommendations with the patient.
Results were also provided in writing at the conclusion of the
visit. If applicable, a reminder letter will be sent to the patient
regarding the next appointment.

BI-RADS CATEGORY  3: Probably benign.

## 2018-04-06 ENCOUNTER — Emergency Department
Admission: EM | Admit: 2018-04-06 | Discharge: 2018-04-06 | Disposition: A | Discharge status: Home / Self Care | Payer: 59 | Attending: Emergency Medicine | Admitting: Emergency Medicine

## 2018-04-06 ENCOUNTER — Encounter: Payer: Self-pay | Admitting: Emergency Medicine

## 2018-04-06 DIAGNOSIS — Z79899 Other long term (current) drug therapy: Secondary | ICD-10-CM | POA: Insufficient documentation

## 2018-04-06 DIAGNOSIS — R070 Pain in throat: Secondary | ICD-10-CM | POA: Diagnosis present

## 2018-04-06 DIAGNOSIS — J029 Acute pharyngitis, unspecified: Secondary | ICD-10-CM | POA: Insufficient documentation

## 2018-04-06 DIAGNOSIS — M7918 Myalgia, other site: Secondary | ICD-10-CM | POA: Diagnosis not present

## 2018-04-06 DIAGNOSIS — I10 Essential (primary) hypertension: Secondary | ICD-10-CM | POA: Diagnosis not present

## 2018-04-06 LAB — GROUP A STREP BY PCR: GROUP A STREP BY PCR: NOT DETECTED

## 2018-04-06 MED ORDER — LIDOCAINE VISCOUS HCL 2 % MT SOLN: 5.0000 mL | Freq: Four times a day (QID) | OROMUCOSAL | 0 refills | Status: AC | PRN

## 2018-04-06 MED ORDER — PSEUDOEPH-BROMPHEN-DM 30-2-10 MG/5ML PO SYRP: 5.0000 mL | ORAL_SOLUTION | Freq: Four times a day (QID) | ORAL | 0 refills | Status: DC | PRN

## 2018-04-06 MED ORDER — AMOXICILLIN 500 MG PO CAPS: 500.0000 mg | ORAL_CAPSULE | Freq: Three times a day (TID) | ORAL | 0 refills | Status: DC

## 2018-04-06 MED ORDER — IBUPROFEN 600 MG PO TABS: 600.0000 mg | ORAL_TABLET | Freq: Three times a day (TID) | ORAL | 0 refills | Status: AC | PRN

## 2018-04-06 NOTE — ED Notes (Signed)
Pt states that it feels like she has strep throat. Pt reports for the past 3 days has had a fever, bodyaches and a sore throat. Pt denies recent exposure to any illness. Pt denies cough, congestion or other sx's.

## 2018-04-06 NOTE — ED Triage Notes (Signed)
Sore throat fever and body aches for the past 2 days. Pt with hx of HTN. Re[prts takes her meds as prescribed. States it usually runs high still. States her MD is aware.

## 2018-04-06 NOTE — ED Provider Notes (Signed)
[facility] Emergency Department Provider Note   ____________________________________________   None    (approximate)  I have reviewed the triage vital signs and the nursing notes.   HISTORY  Chief Complaint Sore Throat; Fever; and Generalized Body Aches    HPI Vanessa Cowan is a 27 y.o. female patient complain of sore throat and body aches for 2 days.  Patient denies nausea, no vomiting, no diarrhea.  Patient states she has not taken a flu shot for this season.  Patient rates her sore throat 8/10.  Patient states she is able to tolerate food and fluids.  No palliative measure for complaint.  Patient blood pressure is elevated. Patient took blood pressure medication prior to arrival.  Past Medical History:  Diagnosis Date  . Hypertension     There are no active problems to display for this patient.   Past Surgical History:  Procedure Laterality Date  . BREAST BIOPSY Left 5.19.17    Prior to Admission medications   Medication Sig Start Date End Date Taking? Authorizing Provider  amoxicillin (AMOXIL) 500 MG capsule Take 1 capsule (500 mg total) by mouth 3 (three) times daily. 04/06/18   Sable Feil, PA-C  brompheniramine-pseudoephedrine-DM 30-2-10 MG/5ML syrup Take 5 mLs by mouth 4 (four) times daily as needed. Mix with 5 mL of viscous lidocaine for swish and swallow. 04/06/18   Sable Feil, PA-C  HYDROcodone-homatropine [facility]) 5-1.5 MG/5ML syrup Take 5 mLs by mouth every 6 (six) hours as needed for cough. 05/15/17   Fisher, Linden Dolin, PA-C  ibuprofen (ADVIL,MOTRIN) 600 MG tablet Take 1 tablet (600 mg total) by mouth every 8 (eight) hours as needed. 04/06/18   Sable Feil, PA-C  IRON, FERROUS SULFATE, PO Take by mouth.    [provider]  labetalol (NORMODYNE) 200 MG tablet Take 1 tablet (200 mg total) by mouth 2 (two) times daily. 05/15/17   Fisher, Linden Dolin, PA-C  levofloxacin (LEVAQUIN) 500 MG tablet Take 1 tablet (500 mg  total) by mouth daily. 05/15/17   Fisher, Linden Dolin, PA-C  lidocaine (XYLOCAINE) 2 % solution Use as directed 5 mLs in the mouth or throat every 6 (six) hours as needed for mouth pain. Mix with 5 mL of Bromfed-DM for swish and swallow. 04/06/18   Sable Feil, PA-C    Allergies Patient has no known allergies.  Family History  Problem Relation Age of Onset  . Breast cancer Neg Hx     Social History Social History   Tobacco Use  . Smoking status: Never Smoker  . Smokeless tobacco: Never Used  Substance Use Topics  . Alcohol use: Not on file  . Drug use: Not on file    Review of Systems Constitutional: No fever/chills.  Body aches. Eyes: No visual changes. ENT: Sore throat. Cardiovascular: Denies chest pain. Respiratory: Denies shortness of breath. Gastrointestinal: No abdominal pain.  No nausea, no vomiting.  No diarrhea.  No constipation. Genitourinary: Negative for dysuria. Musculoskeletal: Negative for back pain. Skin: Negative for rash. Neurological: Negative for headaches, focal weakness or numbness.   ____________________________________________   PHYSICAL EXAM:  VITAL SIGNS: ED Triage Vitals  Enc Vitals Group     BP 04/06/18 0849 (!) 182/105     Pulse Rate 04/06/18 0849 88     Resp 04/06/18 0849 20     Temp 04/06/18 0849 99.4 F (37.4 C)     Temp Source 04/06/18 0849 Oral     SpO2 04/06/18 0849 98 %  Weight 04/06/18 0850 300 lb (136.1 kg)     Height 04/06/18 0850 5\' 7"  (1.702 m)     Head Circumference --      Peak Flow --      Pain Score 04/06/18 0850 8     Pain Loc --      Pain Edu? --      Excl. in [place]? --    Constitutional: Alert and oriented. Well appearing and in no acute distress.  Morbid obesity. Eyes: Conjunctivae are normal. PERRL. EOMI. Head: Atraumatic. Nose: No congestion/rhinnorhea. Mouth/Throat: Mucous membranes are moist.  Pharynx is erythematous. Neck: No stridor.  Hematological/Lymphatic/Immunilogical: Bilateral cervical  lymphadenopathy. Cardiovascular: Normal rate, regular rhythm. Grossly normal heart sounds.  Good peripheral circulation.  Elevated blood pressure. Respiratory: Normal respiratory effort.  No retractions. Lungs CTAB. Skin:  Skin is warm, dry and intact. No rash noted. Psychiatric: Mood and affect are normal. Speech and behavior are normal.  ____________________________________________   LABS (all labs ordered are listed, but only abnormal results are displayed)  Labs Reviewed  GROUP A STREP BY PCR   ____________________________________________  EKG   ____________________________________________  RADIOLOGY  ED MD interpretation:    Official radiology report(s): No results found.  ____________________________________________   PROCEDURES  Procedure(s) performed: None  Procedures  Critical Care performed: No  ____________________________________________   INITIAL IMPRESSION / ASSESSMENT AND PLAN / ED COURSE  As part of my medical decision making, I reviewed the following data within the electronic MEDICAL RECORD NUMBER    Sore throat secondary to tonsillitis.  Patient given discharge care instruction advised take medication directed.  Patient plans follow-up PCP if condition persist.      ____________________________________________   FINAL CLINICAL IMPRESSION(S) / ED DIAGNOSES  Final diagnoses:  Pharyngitis, unspecified etiology     ED Discharge Orders         Ordered    amoxicillin (AMOXIL) 500 MG capsule  3 times daily     04/06/18 1030    lidocaine (XYLOCAINE) 2 % solution  Every 6 hours PRN     04/06/18 1030    brompheniramine-pseudoephedrine-DM 30-2-10 MG/5ML syrup  4 times daily PRN     04/06/18 1030    ibuprofen (ADVIL,MOTRIN) 600 MG tablet  Every 8 hours PRN     04/06/18 1030           Note:  This document was prepared using Dragon voice recognition software and may include unintentional dictation errors.    Sable Feil,  PA-C 04/06/18 1031    Drenda Freeze, MD 04/06/18 [phone]

## 2018-04-06 NOTE — ED Notes (Signed)
First Nurse Note: Patient complaining of body aches and sore throat.  Wearing adult face mask.

## 2020-12-29 ENCOUNTER — Encounter: Payer: Self-pay | Admitting: Emergency Medicine

## 2020-12-29 ENCOUNTER — Emergency Department
Admission: EM | Admit: 2020-12-29 | Discharge: 2020-12-29 | Disposition: A | Discharge status: Home / Self Care | Payer: 59 | Attending: Emergency Medicine | Admitting: Emergency Medicine

## 2020-12-29 ENCOUNTER — Other Ambulatory Visit: Payer: Self-pay

## 2020-12-29 DIAGNOSIS — H66009 Acute suppurative otitis media without spontaneous rupture of ear drum, unspecified ear: Secondary | ICD-10-CM | POA: Insufficient documentation

## 2020-12-29 DIAGNOSIS — I10 Essential (primary) hypertension: Secondary | ICD-10-CM | POA: Diagnosis not present

## 2020-12-29 DIAGNOSIS — H9201 Otalgia, right ear: Secondary | ICD-10-CM | POA: Diagnosis present

## 2020-12-29 DIAGNOSIS — R0981 Nasal congestion: Secondary | ICD-10-CM | POA: Diagnosis not present

## 2020-12-29 DIAGNOSIS — Z79899 Other long term (current) drug therapy: Secondary | ICD-10-CM | POA: Diagnosis not present

## 2020-12-29 DIAGNOSIS — H66001 Acute suppurative otitis media without spontaneous rupture of ear drum, right ear: Secondary | ICD-10-CM

## 2020-12-29 MED ORDER — AMOXICILLIN 500 MG PO CAPS: 500.0000 mg | ORAL_CAPSULE | Freq: Three times a day (TID) | ORAL | 0 refills | Status: AC

## 2020-12-29 NOTE — ED Triage Notes (Signed)
Pt reports nasal congestion for a few days and then yesterday her right ear started hurting.

## 2020-12-29 NOTE — ED Provider Notes (Signed)
[facility] Emergency Department Provider Note ____________________________________________  Time seen: 1245  I have reviewed the triage vital signs and the nursing notes.  HISTORY  Chief Complaint  Otalgia and Nasal Congestion   HPI Vanessa Cowan is a 30 y.o. female presents to the ER today with complaint of right ear pain and nasal congestion.  She reports this started 5 days ago.  She describes the ear pain is achy and full.  She denies drainage or loss of hearing.  She is blowing yellow/clear mucus that is sometimes blood-tinged out of her nose.  She currently denies headache, runny nose, sore throat, cough, shortness of breath, fever, chills or body aches.  She has not had sick contacts or exposure to COVID that she is aware of.  She has not had her COVID vaccines.  She has tried DayQuil and NyQuil OTC with some relief of symptoms.  Past Medical History:  Diagnosis Date   Hypertension     There are no problems to display for this patient.   Past Surgical History:  Procedure Laterality Date   BREAST BIOPSY Left 5.19.17    Prior to Admission medications   Medication Sig Start Date End Date Taking? Authorizing Provider  amoxicillin (AMOXIL) 500 MG capsule Take 1 capsule (500 mg total) by mouth 3 (three) times daily. 12/29/20   Jearld Fenton, NP  ibuprofen (ADVIL,MOTRIN) 600 MG tablet Take 1 tablet (600 mg total) by mouth every 8 (eight) hours as needed. 04/06/18   Sable Feil, PA-C  IRON, FERROUS SULFATE, PO Take by mouth.    [provider]  labetalol (NORMODYNE) 200 MG tablet Take 1 tablet (200 mg total) by mouth 2 (two) times daily. 05/15/17   Fisher, Linden Dolin, PA-C  lidocaine (XYLOCAINE) 2 % solution Use as directed 5 mLs in the mouth or throat every 6 (six) hours as needed for mouth pain. Mix with 5 mL of Bromfed-DM for swish and swallow. 04/06/18   Sable Feil, PA-C    Allergies Patient has no known allergies.  Family History   Problem Relation Age of Onset   Breast cancer Neg Hx     Social History Social History   Tobacco Use   Smoking status: Never   Smokeless tobacco: Never  Vaping Use   Vaping Use: Never used    Review of Systems  Constitutional: Negative for fever, chills or body aches. Eyes: Negative for eye pain, eye redness, discharge or visual changes. ENT: Positive for ear pain and nasal congestion.  Negative for runny nose or sore throat. Cardiovascular: Negative for chest pain or chest tightness. Respiratory: Negative for cough or shortness of breath. GSkin: Negative for rash. Neurological: Negative for headaches,, dizziness. ____________________________________________  PHYSICAL EXAM:  VITAL SIGNS: ED Triage Vitals  Enc Vitals Group     BP 12/29/20 1150 (!) 194/133     Pulse Rate 12/29/20 1150 88     Resp 12/29/20 1150 20     Temp 12/29/20 1150 98.5 F (36.9 C)     Temp Source 12/29/20 1150 Oral     SpO2 12/29/20 1150 94 %     Weight 12/29/20 1149 300 lb (136.1 kg)     Height 12/29/20 1149 '5\' 7"'$  (1.702 m)     Head Circumference --      Peak Flow --      Pain Score 12/29/20 1147 10     Pain Loc --      Pain Edu? --  Excl. in [place]? --     Constitutional: Alert and oriented.  Obese, in no distress. Head: Normocephalic. Eyes: Conjunctivae are normal. Normal extraocular movements Right Ear: Canals clear. TMs red and bulging.  No discharge noted. Mouth/Throat: Mucous membranes are moist.  No posterior pharynx erythema or exudate noted. Hematological/Lymphatic/Immunological: No cervical lymphadenopathy. Cardiovascular: Normal rate, regular rhythm.  Respiratory: Normal respiratory effort. No wheezes/rales/rhonchi. Neurologic:  Normal speech and language. No gross focal neurologic deficits are appreciated. Skin:  Skin is warm, dry and intact. No rash noted.  _____________________________________________  INITIAL IMPRESSION / ASSESSMENT AND PLAN / ED COURSE  Right Ear  Pain, Nasal Congestion:  DDx include ETD, otitis media, viral URI, allergic rhinitis, covid, influenza Exam consistent with a right otitis media Rx for Amoxicillin 500 mg 3 x day x 10 days  Recommend Ibuprofen 600 mg OTC every 8 hours as needed with food for discomfort Discussed COVID testing although at this point it would not change our treatment plan, she is okay with holding off on this Can use Flonase OTC 2 times daily as needed for congestion Follow-up with PCP as an outpatient ____________________________________________  FINAL CLINICAL IMPRESSION(S) / ED DIAGNOSES  Final diagnoses:  Non-recurrent acute suppurative otitis media of right ear without spontaneous rupture of tympanic membrane  Nasal congestion      Jearld Fenton, NP 12/29/20 [place]    Harvest Dark, MD 12/29/20 1531

## 2020-12-29 NOTE — Discharge Instructions (Addendum)
You were seen today for right ear pain. Your exam shows a right ear infection.  I have sent antibiotics 3 times daily for the next 10 days.  You may take ibuprofen 600 mg over-the-counter as needed for pain and inflammation.  You also may take Flonase over-the-counter as needed for nasal congestion.  Please follow-up with your PCP if symptoms persist or worsen.

## 2021-10-20 DIAGNOSIS — I10 Essential (primary) hypertension: Secondary | ICD-10-CM | POA: Diagnosis not present

## 2021-10-20 DIAGNOSIS — I671 Cerebral aneurysm, nonruptured: Secondary | ICD-10-CM | POA: Diagnosis not present

## 2021-10-20 DIAGNOSIS — G9389 Other specified disorders of brain: Secondary | ICD-10-CM | POA: Diagnosis not present

## 2021-10-20 DIAGNOSIS — R110 Nausea: Secondary | ICD-10-CM | POA: Diagnosis not present

## 2021-10-20 DIAGNOSIS — G919 Hydrocephalus, unspecified: Secondary | ICD-10-CM | POA: Diagnosis not present

## 2021-10-20 DIAGNOSIS — R519 Headache, unspecified: Secondary | ICD-10-CM | POA: Diagnosis not present

## 2021-10-20 DIAGNOSIS — R000 Tachycardia, unspecified: Secondary | ICD-10-CM | POA: Diagnosis not present

## 2021-10-20 DIAGNOSIS — G4489 Other headache syndrome: Secondary | ICD-10-CM | POA: Diagnosis not present

## 2021-10-20 DIAGNOSIS — R0902 Hypoxemia: Secondary | ICD-10-CM | POA: Diagnosis not present

## 2021-10-20 DIAGNOSIS — G935 Compression of brain: Secondary | ICD-10-CM | POA: Diagnosis not present

## 2021-10-20 DIAGNOSIS — M6281 Muscle weakness (generalized): Secondary | ICD-10-CM | POA: Diagnosis not present

## 2021-10-20 DIAGNOSIS — R609 Edema, unspecified: Secondary | ICD-10-CM | POA: Diagnosis not present

## 2021-10-21 DIAGNOSIS — I10 Essential (primary) hypertension: Secondary | ICD-10-CM | POA: Diagnosis not present

## 2021-10-21 DIAGNOSIS — E669 Obesity, unspecified: Secondary | ICD-10-CM | POA: Diagnosis not present

## 2021-10-21 DIAGNOSIS — R110 Nausea: Secondary | ICD-10-CM | POA: Diagnosis not present

## 2021-10-21 DIAGNOSIS — M21861 Other specified acquired deformities of right lower leg: Secondary | ICD-10-CM | POA: Diagnosis not present

## 2021-10-21 DIAGNOSIS — G935 Compression of brain: Secondary | ICD-10-CM | POA: Diagnosis not present

## 2021-10-21 DIAGNOSIS — R2689 Other abnormalities of gait and mobility: Secondary | ICD-10-CM | POA: Diagnosis not present

## 2021-10-21 DIAGNOSIS — Z86718 Personal history of other venous thrombosis and embolism: Secondary | ICD-10-CM | POA: Diagnosis not present

## 2021-10-21 DIAGNOSIS — G8191 Hemiplegia, unspecified affecting right dominant side: Secondary | ICD-10-CM | POA: Diagnosis not present

## 2021-10-21 DIAGNOSIS — G9389 Other specified disorders of brain: Secondary | ICD-10-CM | POA: Diagnosis not present

## 2021-10-21 DIAGNOSIS — R519 Headache, unspecified: Secondary | ICD-10-CM | POA: Diagnosis not present

## 2021-10-21 DIAGNOSIS — R2681 Unsteadiness on feet: Secondary | ICD-10-CM | POA: Diagnosis not present

## 2021-10-21 DIAGNOSIS — Z6841 Body Mass Index (BMI) 40.0 and over, adult: Secondary | ICD-10-CM | POA: Diagnosis not present

## 2021-10-21 DIAGNOSIS — G919 Hydrocephalus, unspecified: Secondary | ICD-10-CM | POA: Diagnosis not present

## 2021-10-21 DIAGNOSIS — R5381 Other malaise: Secondary | ICD-10-CM | POA: Diagnosis not present

## 2021-10-21 DIAGNOSIS — Q019 Encephalocele, unspecified: Secondary | ICD-10-CM | POA: Diagnosis not present

## 2021-10-21 DIAGNOSIS — M6281 Muscle weakness (generalized): Secondary | ICD-10-CM | POA: Diagnosis not present

## 2021-11-06 DIAGNOSIS — Z86718 Personal history of other venous thrombosis and embolism: Secondary | ICD-10-CM | POA: Diagnosis not present

## 2021-11-06 DIAGNOSIS — Z8679 Personal history of other diseases of the circulatory system: Secondary | ICD-10-CM | POA: Diagnosis not present

## 2021-11-06 DIAGNOSIS — I69351 Hemiplegia and hemiparesis following cerebral infarction affecting right dominant side: Secondary | ICD-10-CM | POA: Diagnosis not present

## 2021-11-06 DIAGNOSIS — Z982 Presence of cerebrospinal fluid drainage device: Secondary | ICD-10-CM | POA: Diagnosis not present

## 2021-11-06 DIAGNOSIS — I10 Essential (primary) hypertension: Secondary | ICD-10-CM | POA: Diagnosis not present

## 2021-11-06 DIAGNOSIS — G919 Hydrocephalus, unspecified: Secondary | ICD-10-CM | POA: Diagnosis not present

## 2021-11-10 DIAGNOSIS — Z982 Presence of cerebrospinal fluid drainage device: Secondary | ICD-10-CM | POA: Diagnosis not present

## 2021-11-10 DIAGNOSIS — G919 Hydrocephalus, unspecified: Secondary | ICD-10-CM | POA: Diagnosis not present

## 2021-11-10 DIAGNOSIS — I10 Essential (primary) hypertension: Secondary | ICD-10-CM | POA: Diagnosis not present

## 2021-11-10 DIAGNOSIS — Z86718 Personal history of other venous thrombosis and embolism: Secondary | ICD-10-CM | POA: Diagnosis not present

## 2021-11-10 DIAGNOSIS — Z8679 Personal history of other diseases of the circulatory system: Secondary | ICD-10-CM | POA: Diagnosis not present

## 2021-11-10 DIAGNOSIS — I69351 Hemiplegia and hemiparesis following cerebral infarction affecting right dominant side: Secondary | ICD-10-CM | POA: Diagnosis not present

## 2021-11-12 DIAGNOSIS — Z8679 Personal history of other diseases of the circulatory system: Secondary | ICD-10-CM | POA: Diagnosis not present

## 2021-11-12 DIAGNOSIS — G919 Hydrocephalus, unspecified: Secondary | ICD-10-CM | POA: Diagnosis not present

## 2021-11-12 DIAGNOSIS — Z982 Presence of cerebrospinal fluid drainage device: Secondary | ICD-10-CM | POA: Diagnosis not present

## 2021-11-12 DIAGNOSIS — I69351 Hemiplegia and hemiparesis following cerebral infarction affecting right dominant side: Secondary | ICD-10-CM | POA: Diagnosis not present

## 2021-11-12 DIAGNOSIS — Z86718 Personal history of other venous thrombosis and embolism: Secondary | ICD-10-CM | POA: Diagnosis not present

## 2021-11-12 DIAGNOSIS — I10 Essential (primary) hypertension: Secondary | ICD-10-CM | POA: Diagnosis not present

## 2021-11-25 DIAGNOSIS — I82409 Acute embolism and thrombosis of unspecified deep veins of unspecified lower extremity: Secondary | ICD-10-CM | POA: Diagnosis not present

## 2021-11-25 DIAGNOSIS — I609 Nontraumatic subarachnoid hemorrhage, unspecified: Secondary | ICD-10-CM | POA: Diagnosis not present

## 2021-11-25 DIAGNOSIS — G8191 Hemiplegia, unspecified affecting right dominant side: Secondary | ICD-10-CM | POA: Diagnosis not present

## 2021-11-25 DIAGNOSIS — I671 Cerebral aneurysm, nonruptured: Secondary | ICD-10-CM | POA: Diagnosis not present

## 2021-12-24 DIAGNOSIS — G919 Hydrocephalus, unspecified: Secondary | ICD-10-CM | POA: Diagnosis not present

## 2021-12-24 DIAGNOSIS — Z8679 Personal history of other diseases of the circulatory system: Secondary | ICD-10-CM | POA: Diagnosis not present

## 2021-12-24 DIAGNOSIS — I69351 Hemiplegia and hemiparesis following cerebral infarction affecting right dominant side: Secondary | ICD-10-CM | POA: Diagnosis not present

## 2021-12-24 DIAGNOSIS — Z86718 Personal history of other venous thrombosis and embolism: Secondary | ICD-10-CM | POA: Diagnosis not present

## 2021-12-24 DIAGNOSIS — Z982 Presence of cerebrospinal fluid drainage device: Secondary | ICD-10-CM | POA: Diagnosis not present

## 2021-12-24 DIAGNOSIS — I10 Essential (primary) hypertension: Secondary | ICD-10-CM | POA: Diagnosis not present

## 2021-12-30 DIAGNOSIS — Z982 Presence of cerebrospinal fluid drainage device: Secondary | ICD-10-CM | POA: Diagnosis not present

## 2021-12-30 DIAGNOSIS — G919 Hydrocephalus, unspecified: Secondary | ICD-10-CM | POA: Diagnosis not present

## 2021-12-30 DIAGNOSIS — Z8679 Personal history of other diseases of the circulatory system: Secondary | ICD-10-CM | POA: Diagnosis not present

## 2021-12-30 DIAGNOSIS — I10 Essential (primary) hypertension: Secondary | ICD-10-CM | POA: Diagnosis not present

## 2021-12-30 DIAGNOSIS — Z86718 Personal history of other venous thrombosis and embolism: Secondary | ICD-10-CM | POA: Diagnosis not present

## 2021-12-30 DIAGNOSIS — I69351 Hemiplegia and hemiparesis following cerebral infarction affecting right dominant side: Secondary | ICD-10-CM | POA: Diagnosis not present
